# Patient Record
Sex: Female | Born: 1995 | Race: White | Hispanic: No | Marital: Single | Smoking: Never smoker
Health system: Southern US, Community
[De-identification: ages and names within clinical notes are randomized; demographics above are authoritative.]

---

## 2018-06-13 ENCOUNTER — Encounter: Payer: Self-pay | Admitting: *Deleted

## 2018-06-13 ENCOUNTER — Other Ambulatory Visit: Payer: Self-pay

## 2018-06-13 ENCOUNTER — Emergency Department: Payer: BLUE CROSS/BLUE SHIELD

## 2018-06-13 ENCOUNTER — Emergency Department
Admission: EM | Admit: 2018-06-13 | Discharge: 2018-06-13 | Disposition: A | Discharge status: Home / Self Care | Payer: BLUE CROSS/BLUE SHIELD | Attending: Emergency Medicine | Admitting: Emergency Medicine

## 2018-06-13 DIAGNOSIS — E860 Dehydration: Secondary | ICD-10-CM | POA: Insufficient documentation

## 2018-06-13 DIAGNOSIS — J020 Streptococcal pharyngitis: Secondary | ICD-10-CM

## 2018-06-13 DIAGNOSIS — J029 Acute pharyngitis, unspecified: Secondary | ICD-10-CM | POA: Diagnosis present

## 2018-06-13 LAB — URINALYSIS, COMPLETE (UACMP) WITH MICROSCOPIC
Bilirubin Urine: NEGATIVE
Glucose, UA: NEGATIVE mg/dL
Ketones, ur: 20 mg/dL — AB
Leukocytes,Ua: NEGATIVE
Nitrite: NEGATIVE
Protein, ur: 100 mg/dL — AB
RBC / HPF: >50 RBC/hpf — ABNORMAL HIGH (ref 0–5)
Specific Gravity, Urine: 1.017 (ref 1.005–1.030)
pH: 5 (ref 5.0–8.0)

## 2018-06-13 LAB — CBC
HCT: 39.4 % (ref 36.0–46.0)
Hemoglobin: 12.9 g/dL (ref 12.0–15.0)
MCH: 29.9 pg (ref 26.0–34.0)
MCHC: 32.7 g/dL (ref 30.0–36.0)
MCV: 91.4 fL (ref 80.0–100.0)
Platelets: 355 10*3/uL (ref 150–400)
RBC: 4.31 MIL/uL (ref 3.87–5.11)
RDW: 12.4 % (ref 11.5–15.5)
WBC: 19.3 10*3/uL — ABNORMAL HIGH (ref 4.0–10.5)
nRBC: 0 % (ref 0.0–0.2)

## 2018-06-13 LAB — GROUP A STREP BY PCR: GROUP A STREP BY PCR: DETECTED — AB

## 2018-06-13 LAB — LIPASE, BLOOD: Lipase: 20 U/L (ref 11–51)

## 2018-06-13 LAB — COMPREHENSIVE METABOLIC PANEL
ALT: 12 U/L (ref 0–44)
AST: 16 U/L (ref 15–41)
Albumin: 4 g/dL (ref 3.5–5.0)
Alkaline Phosphatase: 53 U/L (ref 38–126)
Anion gap: 14 (ref 5–15)
BUN: 7 mg/dL (ref 6–20)
CO2: 20 mmol/L — ABNORMAL LOW (ref 22–32)
Calcium: 9.6 mg/dL (ref 8.9–10.3)
Chloride: 104 mmol/L (ref 98–111)
Creatinine, Ser: 0.7 mg/dL (ref 0.44–1.00)
GFR calc Af Amer: >60 mL/min (ref >60)
Glucose, Bld: 108 mg/dL — ABNORMAL HIGH (ref 70–99)
Potassium: 3.8 mmol/L (ref 3.5–5.1)
Sodium: 138 mmol/L (ref 135–145)
Total Bilirubin: 0.7 mg/dL (ref 0.3–1.2)
Total Protein: 8.6 g/dL — ABNORMAL HIGH (ref 6.5–8.1)

## 2018-06-13 LAB — INFLUENZA PANEL BY PCR (TYPE A & B)
Influenza A By PCR: NEGATIVE
Influenza B By PCR: NEGATIVE

## 2018-06-13 LAB — MONONUCLEOSIS SCREEN: Mono Screen: NEGATIVE

## 2018-06-13 MED ORDER — DEXAMETHASONE SODIUM PHOSPHATE 10 MG/ML IJ SOLN
10.0000 mg | Freq: Once | INTRAMUSCULAR | Status: AC
  Administered 2018-06-13: 10 mg via INTRAVENOUS
  Filled 2018-06-13: qty 1

## 2018-06-13 MED ORDER — SODIUM CHLORIDE 0.9 % IV BOLUS
1000.0000 mL | Freq: Once | INTRAVENOUS | Status: AC
  Administered 2018-06-13: 1000 mL via INTRAVENOUS

## 2018-06-13 MED ORDER — AMOXICILLIN 875 MG PO TABS: 875.0000 mg | ORAL_TABLET | Freq: Two times a day (BID) | ORAL | 0 refills | Status: AC

## 2018-06-13 MED ORDER — ONDANSETRON 4 MG PO TBDP
ORAL_TABLET | ORAL | Status: AC
  Filled 2018-06-13: qty 1

## 2018-06-13 MED ORDER — ONDANSETRON 4 MG PO TBDP
4.0000 mg | ORAL_TABLET | Freq: Once | ORAL | Status: AC
  Administered 2018-06-13: 4 mg via ORAL

## 2018-06-13 MED ORDER — AMOXICILLIN 875 MG PO TABS: 875.0000 mg | ORAL_TABLET | Freq: Two times a day (BID) | ORAL | 0 refills | Status: DC

## 2018-06-13 MED ORDER — SODIUM CHLORIDE 0.9% FLUSH: 3.0000 mL | Freq: Once | INTRAVENOUS | Status: DC

## 2018-06-13 MED ORDER — SODIUM CHLORIDE 0.9 % IV SOLN
1.0000 g | Freq: Once | INTRAVENOUS | Status: AC
  Administered 2018-06-13: 1 g via INTRAVENOUS
  Filled 2018-06-13: qty 10

## 2018-06-13 MED ORDER — PREDNISONE 10 MG (21) PO TBPK: ORAL_TABLET | ORAL | 0 refills | Status: AC

## 2018-06-13 NOTE — ED Provider Notes (Addendum)
[facility] Emergency Department Provider Note  ____________________________________________   First MD Initiated Contact with Patient 06/13/18 1953     (approximate)  I have reviewed the triage vital signs and the nursing notes.   HISTORY  Chief Complaint Influenza and Sore Throat    HPI Jodi Ortiz is a 23 y.o. female presents emergency department with flulike symptoms, patient complained of fever, chills, body aches.  cough, sore throat, denies vomiting, denies diarrhea; denies chest pain or sob.  Sx for 2-3 days, patient states she was seen at urgent care and had a negative flu and strep test earlier in the week.  She is continued to run a fever and feels horrible.  States hurts to swallow.  States her lymph nodes are really swollen.  She denies any recent travel or other exposures.   No past medical history on file.  There are no active problems to display for this patient.     Prior to Admission medications   Medication Sig Start Date End Date Taking? Authorizing Provider  amoxicillin (AMOXIL) 875 MG tablet Take 1 tablet (875 mg total) by mouth 2 (two) times daily. 06/13/18   Bradrick Kamau, Roselyn Bering, PA-C  predniSONE (STERAPRED UNI-PAK 21 TAB) 10 MG (21) TBPK tablet Take 6 pills on day one then decrease by 1 pill each day 06/13/18   Faythe Ghee, PA-C    Allergies Patient has no known allergies.  No family history on file.  Social History Social History   Tobacco Use  . Smoking status: Never Smoker  . Smokeless tobacco: Never Used  Substance Use Topics  . Alcohol use: Yes  . Drug use: Not Currently    Review of Systems  Constitutional: Positive fever/chills Eyes: No visual changes. ENT: Positive sore throat. Respiratory: Positive cough Genitourinary: Negative for dysuria. Musculoskeletal: Negative for back pain. Skin: Negative for rash.    ____________________________________________   PHYSICAL EXAM:  VITAL SIGNS: ED  Triage Vitals  Enc Vitals Group     BP 06/13/18 1620 114/77     Pulse Rate 06/13/18 1620 (!) 129     Resp 06/13/18 1620 (!) 22     Temp 06/13/18 1620 98.8 F (37.1 C)     Temp Source 06/13/18 1620 Oral     SpO2 06/13/18 1620 100 %     Weight 06/13/18 1619 135 lb (61.2 kg)     Height 06/13/18 1619 5\' 6"  (1.676 m)     Head Circumference --      Peak Flow --      Pain Score 06/13/18 1626 8     Pain Loc --      Pain Edu? --      Excl. in GC? --     Constitutional: Alert and oriented. Well appearing and in no acute distress. Eyes: Conjunctivae are normal.  Head: Atraumatic. Nose: No congestion/rhinnorhea. Mouth/Throat: Mucous membranes are moist throat is red, tonsillar glands are swollen, positive cervical lymphadenopathy.   Neck:  supple no lymphadenopathy noted Cardiovascular: Normal rate, regular rhythm. Heart sounds are normal Respiratory: Normal respiratory effort.  No retractions, lungs c t a  Abd: soft nontender bs normal all 4 quad GU: deferred Musculoskeletal: FROM all extremities, warm and well perfused Neurologic:  Normal speech and language.  Skin:  Skin is warm, dry and intact. No rash noted. Psychiatric: Mood and affect are normal. Speech and behavior are normal.  ____________________________________________   LABS (all labs ordered are listed, but only abnormal results are displayed)  Labs Reviewed  GROUP A STREP BY PCR - Abnormal; Notable for the following components:      Result Value   Group A Strep by PCR DETECTED (*)    All other components within normal limits  COMPREHENSIVE METABOLIC PANEL - Abnormal; Notable for the following components:   CO2 20 (*)    Glucose, Bld 108 (*)    Total Protein 8.6 (*)    All other components within normal limits  CBC - Abnormal; Notable for the following components:   WBC 19.3 (*)    All other components within normal limits  URINALYSIS, COMPLETE (UACMP) WITH MICROSCOPIC - Abnormal; Notable for the following  components:   Color, Urine YELLOW (*)    APPearance HAZY (*)    Hgb urine dipstick LARGE (*)    Ketones, ur 20 (*)    Protein, ur 100 (*)    RBC / HPF >50 (*)    Bacteria, UA RARE (*)    All other components within normal limits  LIPASE, BLOOD  INFLUENZA PANEL BY PCR (TYPE A & B)  MONONUCLEOSIS SCREEN   ____________________________________________   ____________________________________________  RADIOLOGY  Chest x-ray is normal  ____________________________________________   PROCEDURES  Procedure(s) performed: Saline lock, 1 L normal saline IV, Rocephin 1 g IV, Decadron 10 mg IV   Procedures    ____________________________________________   INITIAL IMPRESSION / ASSESSMENT AND PLAN / ED COURSE  Pertinent labs & imaging results that were available during my care of the patient were reviewed by me and considered in my medical decision making (see chart for details).   Patient is 23 year old female presents emergency department complaining of sore throat flulike symptoms for 2 days.  She denies any chest pain or shortness of breath but states she has had nausea and vomiting.  She has been to the urgent care twice in which they tested her for flu and strep which were both negative.  Physical exam shows that the patient has a red throat with very enlarged tonsillar glands and lymphadenopathy in the cervical chain.  Remainder the exam is unremarkable other than the tachycardia and elevated temp.   CBC has elevated WBC of 19.3, urinalysis shows ketones of 20, influenza test is negative, mono test is negative, comprehensive metabolic panel is basically normal, strep test is positive for strep a  Explained all of the findings to the patient.  She had been given 2 L of normal saline IV, due to the positive strep test she was given Decadron 10 mg IV and Rocephin 1 g IV.  She states she feels much better after having the 2 bags of fluids.  She was given a prescription for  amoxicillin and Sterapred.  She is to follow-up with either [facility], her regular doctor return the emergency department if not improving in 3 days or if she begins to worsen.  She states she understands and will comply.  She was discharged in stable condition.    As part of my medical decision making, I reviewed the following data within the electronic MEDICAL RECORD NUMBER Nursing notes reviewed and incorporated, Labs reviewed see results above, Old chart reviewed, Radiograph reviewed chest x-ray is normal, Notes from prior ED visits and [place] Controlled Substance Database  ____________________________________________   FINAL CLINICAL IMPRESSION(S) / ED DIAGNOSES  Final diagnoses:  Acute streptococcal pharyngitis  Dehydration      NEW MEDICATIONS STARTED DURING THIS VISIT:  Discharge Medication List as of 06/13/2018  9:36 PM  START taking these medications   Details  predniSONE (STERAPRED UNI-PAK 21 TAB) 10 MG (21) TBPK tablet Take 6 pills on day one then decrease by 1 pill each day, Normal         Note:  This document was prepared using Dragon voice recognition software and may include unintentional dictation errors.    Faythe Ghee, PA-C 06/13/18 2245    Faythe Ghee, PA-C 06/13/18 2245    Minna Antis, MD 06/13/18 2250

## 2018-06-13 NOTE — ED Notes (Signed)
Pt unable to void at this time. 

## 2018-06-13 NOTE — Discharge Instructions (Signed)
Follow-up with [facility] health services or return emergency department if not improving in 2 to 3 days.  Return emergency department if worsening.  Drink plenty of fluids.  Tylenol and ibuprofen for fever as needed.  Take the antibiotic as prescribed.  You have also been given a steroid pack to decrease the amount of swelling in your throat.

## 2018-06-13 NOTE — ED Notes (Signed)
Pt ambulatory to desk, asking for water, states, "I feel like I'm going to pass out."  Pt taken to subwait, PIV placed and IV fluids started.

## 2018-06-13 NOTE — ED Triage Notes (Addendum)
Pt reports sore throat and flu like sx for 2 days. Pt states n/v.   No abd pain.  Pt reports bodyaches.  Pt alert.  Pt tearful

## 2019-10-12 IMAGING — CR CHEST - 2 VIEW
2 series · 2 of 2 positions shown · non-contrast
Comparison: None.

CLINICAL DATA: Sore throat and flu like symptoms for 2 days.

EXAM:
CHEST - 2 VIEW

[chest pa]
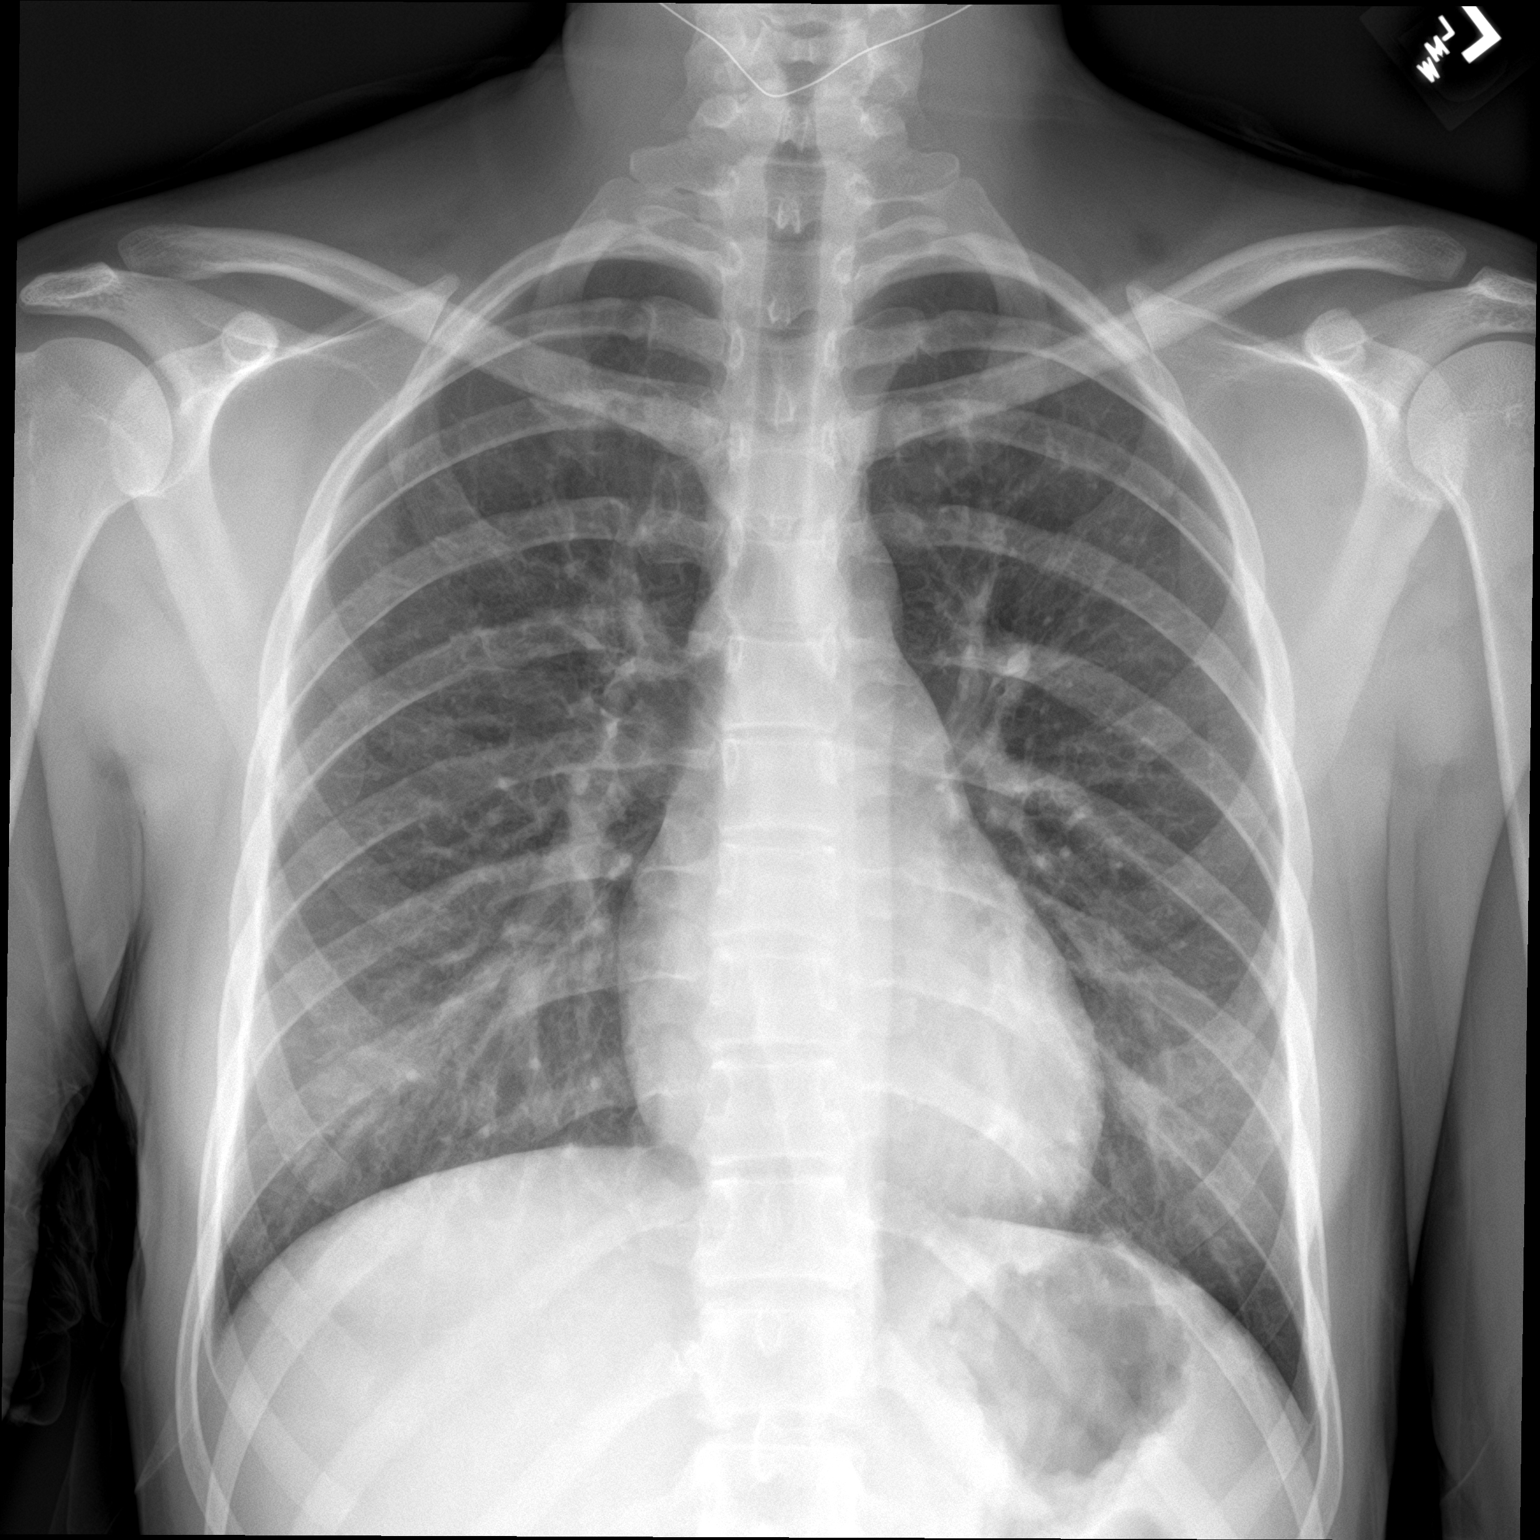

[chest lat]
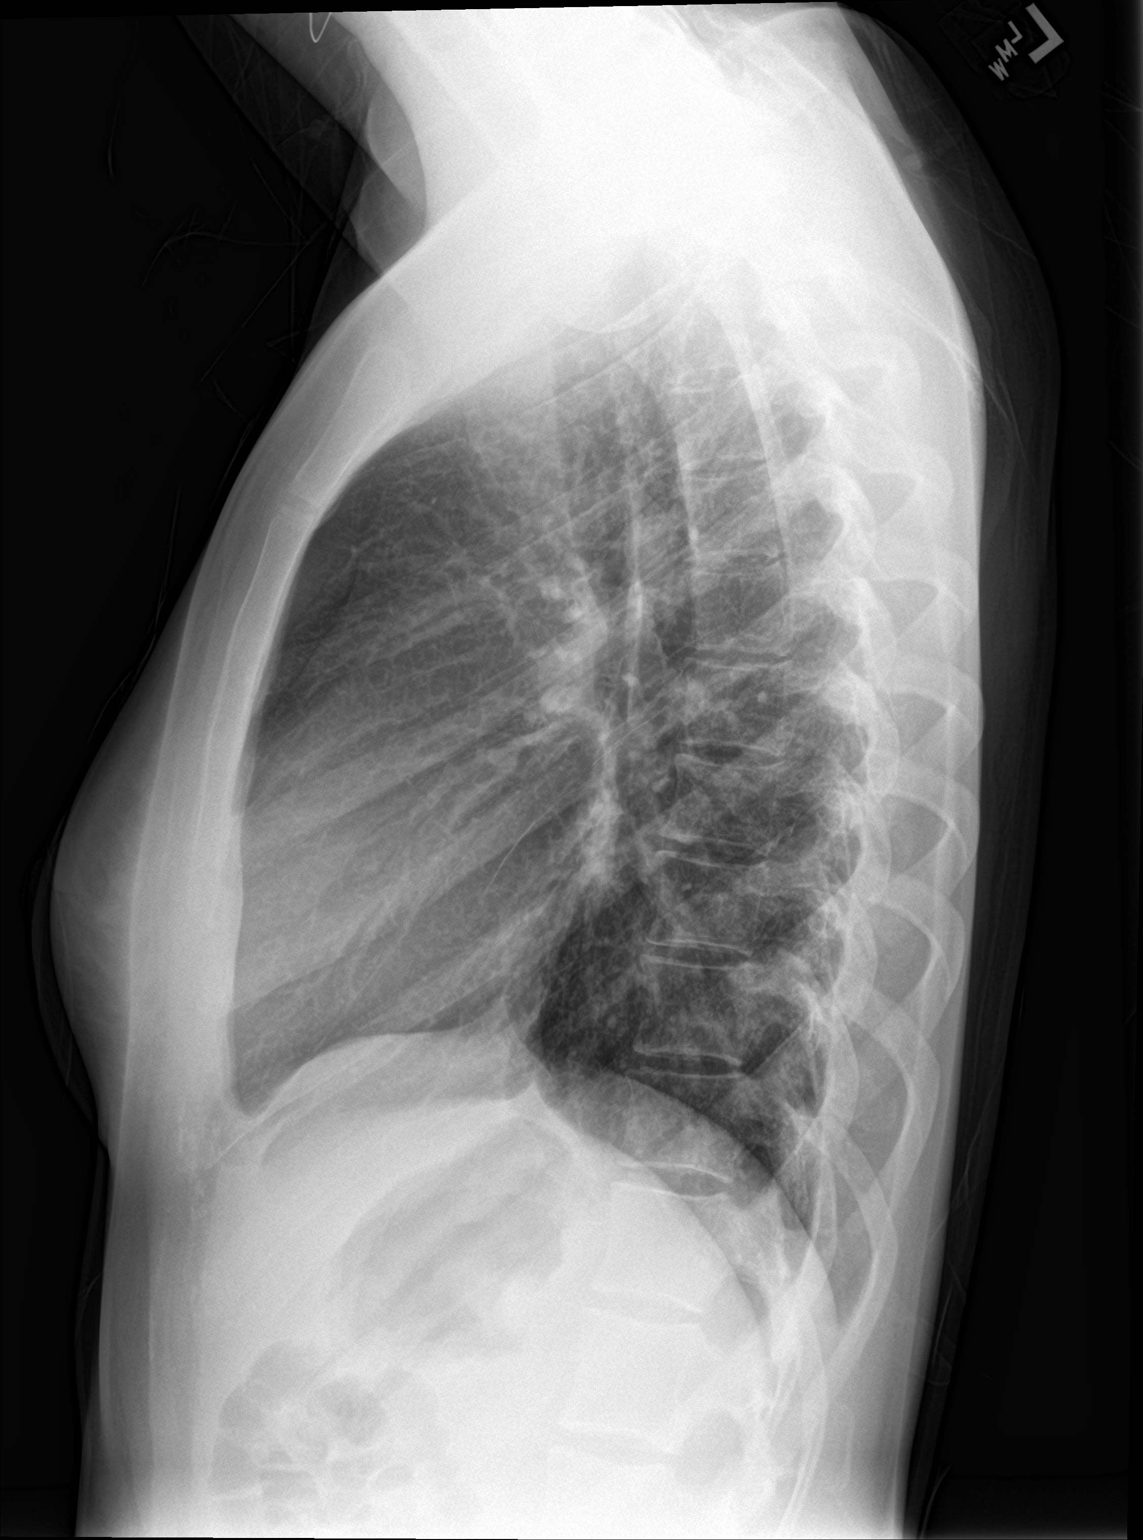

[2 of 2 positions shown; findings below may reference images not displayed]

FINDINGS: Normal heart, mediastinum and hila.

The lungs are clear.  No pleural effusion or pneumothorax.

Skeletal structures are unremarkable.
IMPRESSION: Normal chest radiographs.
